# Patient Record
Sex: Female | Born: 1990 | Race: Black or African American | Hispanic: No | Marital: Single | Smoking: Current some day smoker
Health system: Southern US, Community
[De-identification: ages and names within clinical notes are randomized; demographics above are authoritative.]

## PROBLEM LIST (undated history)

## (undated) DIAGNOSIS — F329 Major depressive disorder, single episode, unspecified: Secondary | ICD-10-CM

## (undated) DIAGNOSIS — F419 Anxiety disorder, unspecified: Secondary | ICD-10-CM

## (undated) DIAGNOSIS — F32A Depression, unspecified: Secondary | ICD-10-CM

---

## 2015-02-24 ENCOUNTER — Emergency Department (HOSPITAL_COMMUNITY)
Admission: EM | Admit: 2015-02-24 | Discharge: 2015-02-25 | Disposition: A | Discharge status: Home / Self Care | Payer: BLUE CROSS/BLUE SHIELD | Attending: Emergency Medicine | Admitting: Emergency Medicine

## 2015-02-24 ENCOUNTER — Encounter (HOSPITAL_COMMUNITY): Payer: Self-pay | Admitting: Emergency Medicine

## 2015-02-24 DIAGNOSIS — R45851 Suicidal ideations: Secondary | ICD-10-CM

## 2015-02-24 DIAGNOSIS — F411 Generalized anxiety disorder: Secondary | ICD-10-CM | POA: Diagnosis not present

## 2015-02-24 DIAGNOSIS — Z3202 Encounter for pregnancy test, result negative: Secondary | ICD-10-CM | POA: Diagnosis not present

## 2015-02-24 DIAGNOSIS — F331 Major depressive disorder, recurrent, moderate: Secondary | ICD-10-CM | POA: Diagnosis not present

## 2015-02-24 DIAGNOSIS — F1210 Cannabis abuse, uncomplicated: Secondary | ICD-10-CM

## 2015-02-24 DIAGNOSIS — F410 Panic disorder [episodic paroxysmal anxiety] without agoraphobia: Secondary | ICD-10-CM | POA: Diagnosis not present

## 2015-02-24 HISTORY — DX: Anxiety disorder, unspecified: F41.9

## 2015-02-24 HISTORY — DX: Depression, unspecified: F32.A

## 2015-02-24 HISTORY — DX: Major depressive disorder, single episode, unspecified: F32.9

## 2015-02-24 LAB — URINALYSIS, ROUTINE W REFLEX MICROSCOPIC
BILIRUBIN URINE: NEGATIVE
GLUCOSE, UA: NEGATIVE mg/dL
Hgb urine dipstick: NEGATIVE
Ketones, ur: NEGATIVE mg/dL
Leukocytes, UA: NEGATIVE
Nitrite: NEGATIVE
PROTEIN: NEGATIVE mg/dL
SPECIFIC GRAVITY, URINE: 1.023 (ref 1.005–1.030)
Urobilinogen, UA: 2 mg/dL — ABNORMAL HIGH (ref 0.0–1.0)
pH: 7 (ref 5.0–8.0)

## 2015-02-24 LAB — POC URINE PREG, ED: Preg Test, Ur: NEGATIVE

## 2015-02-24 LAB — COMPREHENSIVE METABOLIC PANEL
ALK PHOS: 95 U/L (ref 39–117)
ALT: 14 U/L (ref 0–35)
AST: 17 U/L (ref 0–37)
Albumin: 3.9 g/dL (ref 3.5–5.2)
Anion gap: 10 (ref 5–15)
BILIRUBIN TOTAL: 0.9 mg/dL (ref 0.3–1.2)
BUN: 10 mg/dL (ref 6–23)
CO2: 28 mmol/L (ref 19–32)
Calcium: 9.5 mg/dL (ref 8.4–10.5)
Chloride: 103 mmol/L (ref 96–112)
Creatinine, Ser: 0.61 mg/dL (ref 0.50–1.10)
GFR calc Af Amer: >90 mL/min (ref >90)
GFR calc non Af Amer: >90 mL/min (ref >90)
Glucose, Bld: 107 mg/dL — ABNORMAL HIGH (ref 70–99)
Potassium: 3.7 mmol/L (ref 3.5–5.1)
Sodium: 141 mmol/L (ref 135–145)
Total Protein: 8 g/dL (ref 6.0–8.3)

## 2015-02-24 LAB — CBC
HCT: 42.6 % (ref 36.0–46.0)
Hemoglobin: 14.1 g/dL (ref 12.0–15.0)
MCH: 30.9 pg (ref 26.0–34.0)
MCHC: 33.1 g/dL (ref 30.0–36.0)
MCV: 93.4 fL (ref 78.0–100.0)
Platelets: 263 10*3/uL (ref 150–400)
RBC: 4.56 MIL/uL (ref 3.87–5.11)
RDW: 12.7 % (ref 11.5–15.5)
WBC: 12.2 10*3/uL — ABNORMAL HIGH (ref 4.0–10.5)

## 2015-02-24 LAB — RAPID URINE DRUG SCREEN, HOSP PERFORMED
Amphetamines: NOT DETECTED
BARBITURATES: NOT DETECTED
BENZODIAZEPINES: NOT DETECTED
Cocaine: NOT DETECTED
Opiates: NOT DETECTED
Tetrahydrocannabinol: POSITIVE — AB

## 2015-02-24 LAB — ETHANOL

## 2015-02-24 MED ORDER — ONDANSETRON HCL 4 MG PO TABS: 4.0000 mg | ORAL_TABLET | Freq: Three times a day (TID) | ORAL | Status: DC | PRN

## 2015-02-24 MED ORDER — ACETAMINOPHEN 325 MG PO TABS: 650.0000 mg | ORAL_TABLET | ORAL | Status: DC | PRN

## 2015-02-24 MED ORDER — LORAZEPAM 1 MG PO TABS: 1.0000 mg | ORAL_TABLET | Freq: Three times a day (TID) | ORAL | Status: DC | PRN

## 2015-02-24 NOTE — ED Notes (Signed)
Pt. and belongings wanded by security 

## 2015-02-24 NOTE — ED Notes (Signed)
Per EMS-suicidal, picked patient up in bathroom at [employer]-was having panic attack-history of depression-thoughts of hurting self, no plan

## 2015-02-24 NOTE — ED Notes (Signed)
Bed: [facility]PT3 Expected date:  Expected time:  Means of arrival:  Comments: EMS-SI

## 2015-02-24 NOTE — ED Notes (Signed)
Bed: WA30 Expected date:  Expected time:  Means of arrival:  Comments: [facility] 

## 2015-02-24 NOTE — ED Provider Notes (Signed)
CSN: [id number]     Arrival date & time 02/24/15  1524 History   This chart was scribed for non-physician practitioner, Teressa Lower, NP working with Pricilla Loveless, MD, by Lionel December, ED Scribe. This patient was seen in room WTR3/[facility]PT3 and the patient's care was started at 4:38 PM.   Chief Complaint  Patient presents with  . Suicidal     (Consider location/radiation/quality/duration/timing/severity/associated sxs/prior Treatment) The history is provided by the patient. No language interpreter was used.    HPI Comments: Angela Reeves is a 24 y.o. female who presents to the Emergency Department complaining of having a panic attack today and last had one about a month ago.  Patient notes that she is constantly anxious and stressed with school.  She has thought about hurting herself and last tried three years ago.  She saw a counselor on campus a month and a half ago but never kept up with her visits.  She has never been on any anxiety medications and has never seen any physical aspects of her anxiety.  She denies any fevers and does not use any drugs.  Patient drinks a couple times a week.  Her last period was 1 month ago.    Past Medical History  Diagnosis Date  . Anxiety   . Depression    History reviewed. No pertinent past surgical history. No family history on file. History  Substance Use Topics  . Smoking status: Never Smoker   . Smokeless tobacco: Not on file  . Alcohol Use: No   OB History    No data available     Review of Systems  Psychiatric/Behavioral: Positive for suicidal ideas.  All other systems reviewed and are negative.     Allergies  Review of patient's allergies indicates not on file.  Home Medications   Prior to Admission medications   Not on File   BP 107/72 mmHg  Pulse 81  Temp(Src) 98.3 F (36.8 C) (Oral)  Resp 18  SpO2 100%  LMP 01/24/2015 Physical Exam  Constitutional: She is oriented to person, place, and time. She appears  well-nourished.  HENT:  Head: Normocephalic and atraumatic.  Cardiovascular: Normal rate and regular rhythm.   Pulmonary/Chest: Effort normal and breath sounds normal.  Musculoskeletal: Normal range of motion.  Neurological: She is alert and oriented to person, place, and time. Coordination normal.  Skin: Skin is warm and dry.  Psychiatric: She expresses suicidal ideation.  Tearful.  Nursing note and vitals reviewed.   ED Course  Procedures (including critical care time)  DIAGNOSTIC STUDIES: Oxygen Saturation is 100% on RA, normal by my interpretation.    COORDINATION OF CARE: 4:41 PM Discussed treatment plan with patient at beside, the patient agrees with the plan and has no further questions at this time.   Labs Review Labs Reviewed  CBC - Abnormal; Notable for the following:    WBC 12.2 (*)    All other components within normal limits  COMPREHENSIVE METABOLIC PANEL  ETHANOL  URINE RAPID DRUG SCREEN (HOSP PERFORMED)  URINALYSIS, ROUTINE W REFLEX MICROSCOPIC  POC URINE PREG, ED    Imaging Review No results found.   EKG Interpretation None      MDM   Final diagnoses:  Anxiety  Suicidal thoughts   Pt was seen by tts and they are trying to find inpt. treatment  I personally performed the services described in this documentation, which was scribed in my presence. The recorded information has been reviewed and is accurate.  Teressa LowerVrinda Shyah Cadmus, NP 02/24/15 1655  Pricilla LovelessScott Goldston, MD 02/25/15 0009

## 2015-02-24 NOTE — ED Notes (Signed)
Pt resting in bed, asking about her next plan, pt instructed she will stay here for the night and talk to TTS at some point, 2 bags of pt belongings placed in locker #30. Pt in no distress at this time.
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
## 2015-02-24 NOTE — ED Notes (Signed)
Pt transferred from TCU, presents with complaint of SI thoughts, no plan.  Pt denies at present.  Denies HI or AV hallucinations  Denies feeling hopeless.  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  Pt reports she started experiencing panic attacks 2 mos ago and although her grades are good they have fallen off.  Pt report she has been speaking with a counselor on campus x 2 mos ago for 1 month.  Pt AAO x 3, calm & cooperative, no distress noted, monitoring for safety, q 15 min checks in effect.

## 2015-02-24 NOTE — BH Assessment (Signed)
Writer informed TTS (K.Chesire) of the consult.  

## 2015-02-25 DIAGNOSIS — F411 Generalized anxiety disorder: Secondary | ICD-10-CM

## 2015-02-25 DIAGNOSIS — R45851 Suicidal ideations: Secondary | ICD-10-CM

## 2015-02-25 DIAGNOSIS — F331 Major depressive disorder, recurrent, moderate: Secondary | ICD-10-CM | POA: Diagnosis not present

## 2015-02-25 DIAGNOSIS — F410 Panic disorder [episodic paroxysmal anxiety] without agoraphobia: Secondary | ICD-10-CM

## 2015-02-25 DIAGNOSIS — F1210 Cannabis abuse, uncomplicated: Secondary | ICD-10-CM | POA: Insufficient documentation

## 2015-02-25 MED ORDER — FLUOXETINE HCL 20 MG PO CAPS: 20.0000 mg | ORAL_CAPSULE | Freq: Every day | ORAL | Status: AC

## 2015-02-25 MED ORDER — FLUOXETINE HCL 20 MG PO CAPS
20.0000 mg | ORAL_CAPSULE | Freq: Every day | ORAL | Status: DC
  Administered 2015-02-25: 20 mg via ORAL
  Filled 2015-02-25: qty 1

## 2015-02-25 MED ORDER — HYDROXYZINE HCL 25 MG PO TABS
25.0000 mg | ORAL_TABLET | Freq: Four times a day (QID) | ORAL | Status: DC | PRN
  Administered 2015-02-25: 25 mg via ORAL
  Filled 2015-02-25: qty 1

## 2015-02-25 MED ORDER — HYDROXYZINE HCL 25 MG PO TABS: 25.0000 mg | ORAL_TABLET | Freq: Four times a day (QID) | ORAL | Status: AC | PRN

## 2015-02-25 NOTE — Consult Note (Signed)
[place] Psychiatry Consult   Reason for Consult:  Panic attack Referring Physician:  EDP Patient Identification: Angela Reeves MRN:  [id number] Principal Diagnosis: Depression, major, recurrent, moderate Diagnosis:   Patient Active Problem List   Diagnosis Date Noted  . Depression, major, recurrent, moderate [F33.1] 02/25/2015    Priority: High  . Generalized anxiety disorder [F41.1] 02/25/2015    Priority: High  . Panic disorder [F41.0] 02/25/2015    Priority: High  . Suicidal ideations [R45.851] 02/25/2015    Priority: High    Total Time spent with patient: 45 minutes  Subjective:   Angela Reeves is a 24 y.o. female patient does not warrant admission.  HPI:  The patient was studying in [employer] yesterday when she became overwhelmed with how much she had to study for an exam today; did not feel she had enough time.  Her anxiety increased until she had a panic attack with suicidal ideations.  After calming down in the ED, she denied suicidal/homicidal ideations, hallucinations, and alcohol/drug issues (social drinker and marijuana smoker, last use two weeks ago).  School has been overwhelming over the past month when her first panic attack occurred.  At that time, she started seeing a counselor at her college but stopped recently.  She continues to deny suicidal/homicidal ideations, hallucinations.  Her family came from [place] and she wants to go home with them.  They have no safety concerns, Rx given for depression and anxiety as she will follow-up with the psychiatrist (who was rounding today) from her college and her counselor.   HPI Elements:   Location:  generalized. Quality:  acute. Severity:  moderate to mild. Timing:  intermittent . Duration:  brief. Context:  stressors.  Past Medical History:  Past Medical History  Diagnosis Date  . Anxiety   . Depression    History reviewed. No pertinent past surgical history. Family History: No family history on  file. Social History:  History  Alcohol Use No     History  Drug Use No    History   Social History  . Marital Status: Single    Spouse Name: N/A  . Number of Children: N/A  . Years of Education: N/A   Social History Main Topics  . Smoking status: Never Smoker   . Smokeless tobacco: Not on file  . Alcohol Use: No  . Drug Use: No  . Sexual Activity: Not on file   Other Topics Concern  . None   Social History Narrative  . None   Additional Social History:    History of alcohol / drug use?: No history of alcohol / drug abuse                     Allergies:  No Known Allergies  Labs:  Results for orders placed or performed during the hospital encounter of 02/24/15 (from the past 48 hour(s))  Urinalysis, Routine w reflex microscopic     Status: Abnormal   Collection Time: 02/24/15  3:24 PM  Result Value Ref Range   Color, Urine YELLOW YELLOW   APPearance CLEAR CLEAR   Specific Gravity, Urine 1.023 1.005 - 1.030   pH 7.0 5.0 - 8.0   Glucose, UA NEGATIVE NEGATIVE mg/dL   Hgb urine dipstick NEGATIVE NEGATIVE   Bilirubin Urine NEGATIVE NEGATIVE   Ketones, ur NEGATIVE NEGATIVE mg/dL   Protein, ur NEGATIVE NEGATIVE mg/dL   Urobilinogen, UA 2.0 (H) 0.0 - 1.0 mg/dL   Nitrite NEGATIVE NEGATIVE   Leukocytes,  UA NEGATIVE NEGATIVE    Comment: MICROSCOPIC NOT DONE ON URINES WITH NEGATIVE PROTEIN, BLOOD, LEUKOCYTES, NITRITE, OR GLUCOSE <1000 mg/dL.  CBC     Status: Abnormal   Collection Time: 02/24/15  4:22 PM  Result Value Ref Range   WBC 12.2 (H) 4.0 - 10.5 K/uL   RBC 4.56 3.87 - 5.11 MIL/uL   Hemoglobin 14.1 12.0 - 15.0 g/dL   HCT 42.6 36.0 - 46.0 %   MCV 93.4 78.0 - 100.0 fL   MCH 30.9 26.0 - 34.0 pg   MCHC 33.1 30.0 - 36.0 g/dL   RDW 12.7 11.5 - 15.5 %   Platelets 263 150 - 400 K/uL  Comprehensive metabolic panel     Status: Abnormal   Collection Time: 02/24/15  4:22 PM  Result Value Ref Range   Sodium 141 135 - 145 mmol/L   Potassium 3.7 3.5 - 5.1  mmol/L   Chloride 103 96 - 112 mmol/L   CO2 28 19 - 32 mmol/L   Glucose, Bld 107 (H) 70 - 99 mg/dL   BUN 10 6 - 23 mg/dL   Creatinine, Ser 0.61 0.50 - 1.10 mg/dL   Calcium 9.5 8.4 - 10.5 mg/dL   Total Protein 8.0 6.0 - 8.3 g/dL   Albumin 3.9 3.5 - 5.2 g/dL   AST 17 0 - 37 U/L   ALT 14 0 - 35 U/L   Alkaline Phosphatase 95 39 - 117 U/L   Total Bilirubin 0.9 0.3 - 1.2 mg/dL   GFR calc non Af Amer >90 >90 mL/min   GFR calc Af Amer >90 >90 mL/min    Comment: (NOTE) The eGFR has been calculated using the CKD EPI equation. This calculation has not been validated in all clinical situations. eGFR's persistently <90 mL/min signify possible Chronic Kidney Disease.    Anion gap 10 5 - 15  Ethanol (ETOH)     Status: None   Collection Time: 02/24/15  4:22 PM  Result Value Ref Range   Alcohol, Ethyl (B) <5 0 - 9 mg/dL    Comment:        LOWEST DETECTABLE LIMIT FOR SERUM ALCOHOL IS 11 mg/dL FOR MEDICAL PURPOSES ONLY   Urine rapid drug screen (hosp performed)     Status: Abnormal   Collection Time: 02/24/15  6:14 PM  Result Value Ref Range   Opiates NONE DETECTED NONE DETECTED   Cocaine NONE DETECTED NONE DETECTED   Benzodiazepines NONE DETECTED NONE DETECTED   Amphetamines NONE DETECTED NONE DETECTED   Tetrahydrocannabinol POSITIVE (A) NONE DETECTED   Barbiturates NONE DETECTED NONE DETECTED    Comment:        DRUG SCREEN FOR MEDICAL PURPOSES ONLY.  IF CONFIRMATION IS NEEDED FOR ANY PURPOSE, NOTIFY LAB WITHIN 5 DAYS.        LOWEST DETECTABLE LIMITS FOR URINE DRUG SCREEN Drug Class       Cutoff (ng/mL) Amphetamine      1000 Barbiturate      200 Benzodiazepine   433 Tricyclics       295 Opiates          300 Cocaine          300 THC              50   POC Urine Pregnancy, ED (if pre-menopausal female) - NOT at [facility]     Status: None   Collection Time: 02/24/15  6:23 PM  Result Value Ref Range   Preg Test, Ur NEGATIVE  NEGATIVE    Comment:        THE SENSITIVITY OF  THIS METHODOLOGY IS >24 mIU/mL     Vitals: Blood pressure 105/65, pulse 67, temperature 98.1 F (36.7 C), temperature source Oral, resp. rate 18, last menstrual period 01/24/2015, SpO2 100 %.  Risk to Self: Suicidal Ideation: Yes-Currently Present Suicidal Intent: No Is patient at risk for suicide?: Yes Suicidal Plan?: Yes-Currently Present Specify Current Suicidal Plan:  (Unspecified) What has been your use of drugs/alcohol within the last 12 months?: Denies substance abuse How many times?: 1 Triggers for Past Attempts: Unpredictable Intentional Self Injurious Behavior: None Risk to Others: Homicidal Ideation: No Thoughts of Harm to Others: No Current Homicidal Intent: No Current Homicidal Plan: No Access to Homicidal Means: No Identified Victim: None History of harm to others?: No Assessment of Violence: None Noted Violent Behavior Description: none Does patient have access to weapons?: No Criminal Charges Pending?: No Does patient have a court date: No Prior Inpatient Therapy: Prior Inpatient Therapy: No Prior Outpatient Therapy: Prior Outpatient Therapy: Yes Prior Therapy Dates: 2016 Prior Therapy Facilty/Provider(s): [facility] Reason for Treatment: Anxiety  Current Facility-Administered Medications  Medication Dose Route Frequency Provider Last Rate Last Dose  . acetaminophen (TYLENOL) tablet 650 mg  650 mg Oral Q4H PRN Glendell Docker, NP      . FLUoxetine (PROZAC) capsule 20 mg  20 mg Oral Daily Patrecia Pour, NP   20 mg at 02/25/15 1102  . hydrOXYzine (ATARAX/VISTARIL) tablet 25 mg  25 mg Oral Q6H PRN Patrecia Pour, NP   25 mg at 02/25/15 1102  . LORazepam (ATIVAN) tablet 1 mg  1 mg Oral Q8H PRN Glendell Docker, NP      . ondansetron (ZOFRAN) tablet 4 mg  4 mg Oral Q8H PRN Glendell Docker, NP       Current Outpatient Prescriptions  Medication Sig Dispense Refill  . FLUoxetine (PROZAC) 20 MG capsule Take 1 capsule (20 mg total) by mouth daily. 30 capsule 0  .  hydrOXYzine (ATARAX/VISTARIL) 25 MG tablet Take 1 tablet (25 mg total) by mouth every 6 (six) hours as needed for anxiety. 30 tablet 0    Musculoskeletal: Strength & Muscle Tone: within normal limits Gait & Station: normal Patient leans: N/A  Psychiatric Specialty Exam:     Blood pressure 105/65, pulse 67, temperature 98.1 F (36.7 C), temperature source Oral, resp. rate 18, last menstrual period 01/24/2015, SpO2 100 %.There is no height or weight on file to calculate BMI.  General Appearance: Casual  Eye Contact::  Fair  Speech:  Normal Rate  Volume:  Normal  Mood:  Depressed, anxious  Affect:  Congruent  Thought Process:  Coherent  Orientation:  Full (Time, Place, and Person)  Thought Content:  Rumination  Suicidal Thoughts:  No  Homicidal Thoughts:  No  Memory:  Immediate;   Good Recent;   Good Remote;   Good  Judgement:  Good  Insight:  Good  Psychomotor Activity:  Decreased  Concentration:  Good  Recall:  Good  Fund of Knowledge:Good  Language: Good  Akathisia:  No  Handed:  Right  AIMS (if indicated):     Assets:  Catering manager Housing Leisure Time Physical Health Resilience Social Support  ADL's:  Intact  Cognition: WNL  Sleep:      Medical Decision Making: Review of Psycho-Social Stressors (1), Review or order clinical lab tests (1) and Review of Medication Regimen & Side Effects (2)  Treatment Plan Summary: Daily contact with  patient to assess and evaluate symptoms and progress in treatment, Medication management and Plan discharge home with her family, follow-up with counselors and psychiatrist at her college, Rx given  Plan:  No evidence of imminent risk to self or others at present.   Disposition: discharge home with her family, follow-up with counselors and psychiatrist at her college, Rx given  Waylan Boga, [place] 02/25/2015 12:46 PM Patient seen face-to-face for psychiatric evaluation, chart reviewed and case discussed with the  physician extender and developed treatment plan. Reviewed the information documented and agree with the treatment plan. Corena Pilgrim, MD

## 2015-02-25 NOTE — BH Assessment (Signed)
[facility] Assessment Progress Note   Clinician spoke briefly to patient.  She said that she always has fleeting thoughts of SI.  No plan at this time.  She says, "I know I could never do anything like that."  [facility]  Also asked if it was a different environment than [facility], said she was nervous here.  Clinician offered encouragement and invited her to talk with psychiatric team as they do rounds.

## 2016-05-25 ENCOUNTER — Ambulatory Visit (INDEPENDENT_AMBULATORY_CARE_PROVIDER_SITE_OTHER): Payer: Self-pay

## 2016-05-25 ENCOUNTER — Ambulatory Visit (HOSPITAL_COMMUNITY)
Admission: EM | Admit: 2016-05-25 | Discharge: 2016-05-25 | Disposition: A | Discharge status: Home / Self Care | Payer: BLUE CROSS/BLUE SHIELD | Attending: Family Medicine | Admitting: Family Medicine

## 2016-05-25 ENCOUNTER — Encounter (HOSPITAL_COMMUNITY): Payer: Self-pay | Admitting: Emergency Medicine

## 2016-05-25 DIAGNOSIS — S4992XA Unspecified injury of left shoulder and upper arm, initial encounter: Secondary | ICD-10-CM

## 2016-05-25 MED ORDER — TETANUS-DIPHTH-ACELL PERTUSSIS 5-2.5-18.5 LF-MCG/0.5 IM SUSP: 0.5000 mL | Freq: Once | INTRAMUSCULAR | Status: DC
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
